# Patient Record
Sex: Female | Born: 2005
Health system: Southern US, Community
[De-identification: ages and names within clinical notes are randomized; demographics above are authoritative.]

## PROBLEM LIST (undated history)

## (undated) DIAGNOSIS — L309 Dermatitis, unspecified: Secondary | ICD-10-CM

## (undated) HISTORY — PX: TONSILLECTOMY AND ADENOIDECTOMY: SUR1326

## (undated) HISTORY — PX: TONSILLECTOMY: SUR1361

---
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
## 2024-01-02 DIAGNOSIS — F331 Major depressive disorder, recurrent, moderate: Secondary | ICD-10-CM | POA: Diagnosis not present

## 2024-01-22 DIAGNOSIS — N3001 Acute cystitis with hematuria: Secondary | ICD-10-CM | POA: Diagnosis not present

## 2024-01-22 DIAGNOSIS — F331 Major depressive disorder, recurrent, moderate: Secondary | ICD-10-CM | POA: Diagnosis not present

## 2024-02-07 DIAGNOSIS — J019 Acute sinusitis, unspecified: Secondary | ICD-10-CM | POA: Diagnosis not present

## 2024-02-07 DIAGNOSIS — H6592 Unspecified nonsuppurative otitis media, left ear: Secondary | ICD-10-CM | POA: Diagnosis not present

## 2024-02-13 DIAGNOSIS — F331 Major depressive disorder, recurrent, moderate: Secondary | ICD-10-CM | POA: Diagnosis not present

## 2024-08-04 DIAGNOSIS — Z30431 Encounter for routine checking of intrauterine contraceptive device: Secondary | ICD-10-CM | POA: Diagnosis not present

## 2024-08-04 DIAGNOSIS — N921 Excessive and frequent menstruation with irregular cycle: Secondary | ICD-10-CM | POA: Diagnosis not present

## 2024-08-04 DIAGNOSIS — Z975 Presence of (intrauterine) contraceptive device: Secondary | ICD-10-CM | POA: Diagnosis not present
